# Patient Record
Sex: Female | Born: 1970 | Race: Black or African American | Hispanic: No | Smoking: Never smoker
Health system: Southern US, Community
[De-identification: ages and names within clinical notes are randomized; demographics above are authoritative.]

## PROBLEM LIST (undated history)

## (undated) HISTORY — PX: TONSILLECTOMY: SUR1361

---

## 2016-08-28 ENCOUNTER — Emergency Department (HOSPITAL_COMMUNITY): Payer: BLUE CROSS/BLUE SHIELD

## 2016-08-28 ENCOUNTER — Emergency Department (HOSPITAL_COMMUNITY)
Admission: EM | Admit: 2016-08-28 | Discharge: 2016-08-29 | Disposition: A | Discharge status: Home / Self Care | Payer: BLUE CROSS/BLUE SHIELD | Attending: Emergency Medicine | Admitting: Emergency Medicine

## 2016-08-28 ENCOUNTER — Encounter (HOSPITAL_COMMUNITY): Payer: Self-pay

## 2016-08-28 DIAGNOSIS — Y999 Unspecified external cause status: Secondary | ICD-10-CM | POA: Diagnosis not present

## 2016-08-28 DIAGNOSIS — Y939 Activity, unspecified: Secondary | ICD-10-CM | POA: Insufficient documentation

## 2016-08-28 DIAGNOSIS — R51 Headache: Secondary | ICD-10-CM | POA: Diagnosis not present

## 2016-08-28 DIAGNOSIS — M546 Pain in thoracic spine: Secondary | ICD-10-CM | POA: Diagnosis not present

## 2016-08-28 DIAGNOSIS — M25562 Pain in left knee: Secondary | ICD-10-CM | POA: Diagnosis present

## 2016-08-28 DIAGNOSIS — Y92410 Unspecified street and highway as the place of occurrence of the external cause: Secondary | ICD-10-CM | POA: Insufficient documentation

## 2016-08-28 DIAGNOSIS — R079 Chest pain, unspecified: Secondary | ICD-10-CM | POA: Diagnosis not present

## 2016-08-28 MED ORDER — METHOCARBAMOL 500 MG PO TABS: 500.0000 mg | ORAL_TABLET | Freq: Two times a day (BID) | ORAL | 0 refills | Status: AC

## 2016-08-28 MED ORDER — IBUPROFEN 800 MG PO TABS
800.0000 mg | ORAL_TABLET | Freq: Three times a day (TID) | ORAL | 0 refills | Status: AC
Start: 2016-08-28 — End: ?

## 2016-08-28 MED ORDER — IBUPROFEN 400 MG PO TABS
600.0000 mg | ORAL_TABLET | Freq: Once | ORAL | Status: AC
  Administered 2016-08-28: 23:00:00 600 mg via ORAL
  Filled 2016-08-28: qty 1

## 2016-08-28 MED ORDER — HYDROCODONE-ACETAMINOPHEN 5-325 MG PO TABS: 2.0000 | ORAL_TABLET | ORAL | 0 refills | Status: AC | PRN
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
## 2016-08-29 DIAGNOSIS — M25562 Pain in left knee: Secondary | ICD-10-CM | POA: Diagnosis not present

## 2016-08-29 NOTE — Progress Notes (Signed)
Orthopedic Tech Progress Note Patient Details:  Meriel FlavorsShanee Berntsen 08/10/1970 [id number]  Ortho Devices Type of Ortho Device: Knee Immobilizer, Crutches Ortho Device/Splint Location: lle Ortho Device/Splint Interventions: Ordered, Application   Trinna PostMartinez, Kyanne Rials J 08/29/2016, 12:09 AM
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
